# Patient Record
Sex: Male | Born: 1975 | Hispanic: No | Marital: Married | Smoking: Never smoker
Health system: Southern US, Community
[De-identification: ages and names within clinical notes are randomized; demographics above are authoritative.]

## PROBLEM LIST (undated history)

## (undated) DIAGNOSIS — G43909 Migraine, unspecified, not intractable, without status migrainosus: Secondary | ICD-10-CM

## (undated) HISTORY — DX: Migraine, unspecified, not intractable, without status migrainosus: G43.909

---

## 2011-02-04 HISTORY — PX: VASECTOMY: SHX75

## 2015-03-19 ENCOUNTER — Encounter: Payer: Self-pay | Admitting: *Deleted

## 2015-03-19 ENCOUNTER — Telehealth: Payer: Self-pay | Admitting: *Deleted

## 2015-03-19 NOTE — Telephone Encounter (Signed)
Pre-Visit Call completed with patient and chart updated.   Pre-Visit Info documented in Specialty Comments under [employer].    

## 2015-03-20 ENCOUNTER — Telehealth: Payer: Self-pay | Admitting: Family

## 2015-03-20 ENCOUNTER — Ambulatory Visit (INDEPENDENT_AMBULATORY_CARE_PROVIDER_SITE_OTHER): Payer: BLUE CROSS/BLUE SHIELD | Admitting: Family

## 2015-03-20 ENCOUNTER — Encounter: Payer: Self-pay | Admitting: Family

## 2015-03-20 VITALS — BP 137/94 | HR 77 | Temp 97.7°F | Resp 16 | Ht 68.5 in | Wt 182.2 lb

## 2015-03-20 DIAGNOSIS — R030 Elevated blood-pressure reading, without diagnosis of hypertension: Secondary | ICD-10-CM

## 2015-03-20 DIAGNOSIS — Z0001 Encounter for general adult medical examination with abnormal findings: Secondary | ICD-10-CM | POA: Diagnosis not present

## 2015-03-20 DIAGNOSIS — G43809 Other migraine, not intractable, without status migrainosus: Secondary | ICD-10-CM

## 2015-03-20 DIAGNOSIS — G43909 Migraine, unspecified, not intractable, without status migrainosus: Secondary | ICD-10-CM | POA: Insufficient documentation

## 2015-03-20 DIAGNOSIS — Z0000 Encounter for general adult medical examination without abnormal findings: Secondary | ICD-10-CM | POA: Diagnosis not present

## 2015-03-20 DIAGNOSIS — R0789 Other chest pain: Secondary | ICD-10-CM

## 2015-03-20 DIAGNOSIS — IMO0001 Reserved for inherently not codable concepts without codable children: Secondary | ICD-10-CM

## 2015-03-20 LAB — URINALYSIS, ROUTINE W REFLEX MICROSCOPIC
BILIRUBIN URINE: NEGATIVE
Hgb urine dipstick: NEGATIVE
Ketones, ur: NEGATIVE
Leukocytes, UA: NEGATIVE
Nitrite: NEGATIVE
PH: 7.5 (ref 5.0–8.0)
RBC / HPF: NONE SEEN (ref 0–?)
SPECIFIC GRAVITY, URINE: 1.02 (ref 1.000–1.030)
TOTAL PROTEIN, URINE-UPE24: NEGATIVE
UROBILINOGEN UA: 0.2 (ref 0.0–1.0)
Urine Glucose: NEGATIVE
WBC, UA: NONE SEEN (ref 0–?)

## 2015-03-20 LAB — CBC WITH DIFFERENTIAL/PLATELET
BASOS PCT: 0.6 % (ref 0.0–3.0)
Basophils Absolute: 0.1 10*3/uL (ref 0.0–0.1)
EOS ABS: 0 10*3/uL (ref 0.0–0.7)
Eosinophils Relative: 0.3 % (ref 0.0–5.0)
HCT: 44.9 % (ref 39.0–52.0)
HEMOGLOBIN: 15.5 g/dL (ref 13.0–17.0)
Lymphocytes Relative: 22.7 % (ref 12.0–46.0)
Lymphs Abs: 2.1 10*3/uL (ref 0.7–4.0)
MCHC: 34.5 g/dL (ref 30.0–36.0)
MCV: 87.4 fl (ref 78.0–100.0)
MONO ABS: 0.4 10*3/uL (ref 0.1–1.0)
Monocytes Relative: 3.9 % (ref 3.0–12.0)
NEUTROS PCT: 72.5 % (ref 43.0–77.0)
Neutro Abs: 6.8 10*3/uL (ref 1.4–7.7)
Platelets: 248 10*3/uL (ref 150.0–400.0)
RBC: 5.14 Mil/uL (ref 4.22–5.81)
RDW: 12.7 % (ref 11.5–15.5)
WBC: 9.4 10*3/uL (ref 4.0–10.5)

## 2015-03-20 LAB — BASIC METABOLIC PANEL
BUN: 13 mg/dL (ref 6–23)
CO2: 30 mEq/L (ref 19–32)
CREATININE: 0.88 mg/dL (ref 0.40–1.50)
Calcium: 9.7 mg/dL (ref 8.4–10.5)
Chloride: 101 mEq/L (ref 96–112)
GFR: 102.72 mL/min (ref >60.00)
Glucose, Bld: 103 mg/dL — ABNORMAL HIGH (ref 70–99)
Potassium: 3.8 mEq/L (ref 3.5–5.1)
Sodium: 139 mEq/L (ref 135–145)

## 2015-03-20 LAB — HEPATIC FUNCTION PANEL
ALBUMIN: 4.8 g/dL (ref 3.5–5.2)
ALK PHOS: 75 U/L (ref 39–117)
ALT: 33 U/L (ref 0–53)
AST: 23 U/L (ref 0–37)
Bilirubin, Direct: 0.1 mg/dL (ref 0.0–0.3)
TOTAL PROTEIN: 7.7 g/dL (ref 6.0–8.3)
Total Bilirubin: 0.4 mg/dL (ref 0.2–1.2)

## 2015-03-20 LAB — LIPID PANEL
CHOL/HDL RATIO: 4
CHOLESTEROL: 184 mg/dL (ref 0–200)
HDL: 42.6 mg/dL (ref >39.00)
LDL Cholesterol: 116 mg/dL — ABNORMAL HIGH (ref 0–99)
NONHDL: 141.63
Triglycerides: 130 mg/dL (ref 0.0–149.0)
VLDL: 26 mg/dL (ref 0.0–40.0)

## 2015-03-20 LAB — TSH: TSH: 1.18 u[IU]/mL (ref 0.35–4.50)

## 2015-03-20 MED ORDER — RANITIDINE HCL 150 MG PO TABS: 150.0000 mg | ORAL_TABLET | Freq: Two times a day (BID) | ORAL | Status: DC

## 2015-03-20 MED ORDER — SUMATRIPTAN SUCCINATE 50 MG PO TABS: 50.0000 mg | ORAL_TABLET | ORAL | Status: DC | PRN

## 2015-03-20 NOTE — Progress Notes (Signed)
Pre visit review using our clinic review tool, if applicable. No additional management support is needed unless otherwise documented below in the visit note. 

## 2015-03-20 NOTE — Patient Instructions (Addendum)
Start zantac twice daily for reflux. Avoid spicy/greasy foods. Eat small frequent meals rather than larger meals. Avoid eating for 1.5 hours before bed. Continue your regular exercise and work on modest weight loss- goal weight is about 165. You may use imitrex as needed for migraine. Work on low sodium diet to help with your blood pressure.  Go to the ER if you develop severe chest pain.  Welcome to [facility]!

## 2015-03-20 NOTE — Telephone Encounter (Signed)
Notified pt and he voices understanding. 

## 2015-03-20 NOTE — Assessment & Plan Note (Addendum)
Suspect GERD. 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  Will also rx zantac, discussed avoiding food x 1.5 hrs before bed.

## 2015-03-20 NOTE — Telephone Encounter (Signed)
Reviewed EKG- notes right bundle branch block. This is a change in the conduction of the heart which usually is not concerning.  However, given his report of intermittent chest pain, I would like to refer him to cardiology and let them decide on appropriate additional tests.

## 2015-03-20 NOTE — Progress Notes (Signed)
Subjective:    Patient ID: Phillip Wheeler, male    DOB: [birth year], 40 y.o.   MRN: [id number]  HPI  Phillip Wheeler is a 40 yr old male who presents today to establish care.    Pmhx is significant for migraine.  Pt reports that he generally experiences migraine once every 2-3 months.   Reports + migraine today. + associated nausea.  Reports that if his migraine is severe uses ibuprofen with some improvement.    Immunizations:declined flu vaccine. Tdap unknown.   Diet:healthy, stopped eating rice, eating whole wheat Exercise: 3-4 miles a day Vision: up to date Dental:  Last year.    Review of Systems  Constitutional: Negative for unexpected weight change.  HENT: Negative for hearing loss and rhinorrhea.   Eyes: Negative for visual disturbance.  Respiratory: Negative for cough and shortness of breath.   Cardiovascular:       Notes some occasional substernal chest pain,  Happens more when he lays flat  Gastrointestinal: Negative for diarrhea and constipation.  Genitourinary: Negative for dysuria and frequency.  Musculoskeletal: Negative for myalgias and arthralgias.  Neurological: Positive for headaches.  Hematological: Negative for adenopathy.  Psychiatric/Behavioral:       Denies depression/anxiety   Past Medical History  Diagnosis Date  . Migraine     Social History   Social History  . Marital Status: Married    Spouse Name: N/A  . Number of Children: N/A  . Years of Education: N/A   Occupational History  . Not on file.   Social History Main Topics  . Smoking status: Never Smoker   . Smokeless tobacco: Never Used  . Alcohol Use: No  . Drug Use: Not on file  . Sexual Activity: Not on file   Other Topics Concern  . Not on file   Social History Narrative    Past Surgical History  Procedure Laterality Date  . Vasectomy  2013    Family History  Problem Relation Age of Onset  . Diabetes Father   . Hypertension Father   . Heart disease Father   .  Hypertension Mother   . Hypothyroidism Mother   . Cancer Neg Hx   . Hyperlipidemia Neg Hx     No Known Allergies  No current outpatient prescriptions on file prior to visit.   No current facility-administered medications on file prior to visit.    BP 137/94 mmHg  Pulse 77  Temp(Src) 97.7 F (36.5 C) (Oral)  Resp 16  Ht 5' 8.5" (1.74 m)  Wt 182 lb 3.2 oz (82.645 kg)  BMI 27.30 kg/m2  SpO2 100%       Objective:   Physical Exam Physical Exam  Constitutional: He is oriented to person, place, and time. He appears well-developed and well-nourished. No distress.  HENT:  Head: Normocephalic and atraumatic.  Right Ear: Tympanic membrane and ear canal normal.  Left Ear: Tympanic membrane and ear canal normal.  Mouth/Throat: Oropharynx is clear and moist.  Eyes: Pupils are equal, round, and reactive to light. No scleral icterus.  Neck: Normal range of motion. No thyromegaly present.  Cardiovascular: Normal rate and regular rhythm.   No murmur heard. Pulmonary/Chest: Effort normal and breath sounds normal. No respiratory distress. He has no wheezes. He has no rales. He exhibits no tenderness.  Abdominal: Soft. Bowel sounds are normal. He exhibits no distension and no mass. There is no tenderness. There is no rebound and no guarding.  Musculoskeletal: He exhibits no edema.  Lymphadenopathy:  He has no cervical adenopathy.  Neurological: He is alert and oriented to person, place, and time. He has normal patellar reflexes. He exhibits normal muscle tone. Coordination normal.  Skin: Skin is warm and dry.  Psychiatric: He has a normal mood and affect. His behavior is normal. Judgment and thought content normal.          Assessment & Plan:   Preventative Care- discussed healthy low sodium diet, exercise, modest weight loss (goal BMI <25).        Assessment & Plan:

## 2015-03-20 NOTE — Assessment & Plan Note (Signed)
Trial of PRN imitrex.

## 2015-03-20 NOTE — Assessment & Plan Note (Signed)
Discussed low sodium diet, exercise, weight loss.  Plan to repeat bp in 3 months.

## 2015-03-23 ENCOUNTER — Telehealth: Payer: Self-pay | Admitting: Family

## 2015-03-23 NOTE — Telephone Encounter (Signed)
Relation to NW:GNFA Call back number:[phone] Pharmacy:  Reason for call:  Patient inquiring of lab results taken 03/20/15

## 2015-03-26 NOTE — Telephone Encounter (Signed)
Notified pt's wife and she voices understanding. 

## 2016-08-28 ENCOUNTER — Ambulatory Visit (INDEPENDENT_AMBULATORY_CARE_PROVIDER_SITE_OTHER): Payer: Self-pay | Admitting: Family

## 2016-08-28 VITALS — BP 119/55 | HR 64 | Temp 97.5°F | Ht 69.0 in | Wt 178.8 lb

## 2016-08-28 DIAGNOSIS — Z0000 Encounter for general adult medical examination without abnormal findings: Secondary | ICD-10-CM

## 2016-08-28 LAB — BASIC METABOLIC PANEL
BUN: 12 mg/dL (ref 6–23)
CHLORIDE: 104 meq/L (ref 96–112)
CO2: 28 meq/L (ref 19–32)
Calcium: 9.7 mg/dL (ref 8.4–10.5)
Creatinine, Ser: 0.9 mg/dL (ref 0.40–1.50)
GFR: 99.34 mL/min (ref >60.00)
Glucose, Bld: 106 mg/dL — ABNORMAL HIGH (ref 70–99)
POTASSIUM: 4.1 meq/L (ref 3.5–5.1)
SODIUM: 139 meq/L (ref 135–145)

## 2016-08-28 LAB — URINALYSIS, ROUTINE W REFLEX MICROSCOPIC
BILIRUBIN URINE: NEGATIVE
Hgb urine dipstick: NEGATIVE
KETONES UR: NEGATIVE
LEUKOCYTES UA: NEGATIVE
Nitrite: NEGATIVE
PH: 5.5 (ref 5.0–8.0)
RBC / HPF: NONE SEEN (ref 0–?)
SPECIFIC GRAVITY, URINE: 1.025 (ref 1.000–1.030)
Total Protein, Urine: NEGATIVE
URINE GLUCOSE: NEGATIVE
UROBILINOGEN UA: 0.2 (ref 0.0–1.0)

## 2016-08-28 LAB — CBC WITH DIFFERENTIAL/PLATELET
BASOS PCT: 0.5 % (ref 0.0–3.0)
Basophils Absolute: 0 10*3/uL (ref 0.0–0.1)
EOS PCT: 0.9 % (ref 0.0–5.0)
Eosinophils Absolute: 0.1 10*3/uL (ref 0.0–0.7)
HEMATOCRIT: 46.6 % (ref 39.0–52.0)
HEMOGLOBIN: 15.9 g/dL (ref 13.0–17.0)
LYMPHS PCT: 37 % (ref 12.0–46.0)
Lymphs Abs: 2.4 10*3/uL (ref 0.7–4.0)
MCHC: 34.1 g/dL (ref 30.0–36.0)
MCV: 89.7 fl (ref 78.0–100.0)
MONOS PCT: 7.5 % (ref 3.0–12.0)
Monocytes Absolute: 0.5 10*3/uL (ref 0.1–1.0)
NEUTROS ABS: 3.5 10*3/uL (ref 1.4–7.7)
Neutrophils Relative %: 54.1 % (ref 43.0–77.0)
PLATELETS: 219 10*3/uL (ref 150.0–400.0)
RBC: 5.19 Mil/uL (ref 4.22–5.81)
RDW: 12.8 % (ref 11.5–15.5)
WBC: 6.5 10*3/uL (ref 4.0–10.5)

## 2016-08-28 LAB — LIPID PANEL
Cholesterol: 162 mg/dL (ref 0–200)
HDL: 41 mg/dL (ref >39.00)
LDL Cholesterol: 93 mg/dL (ref 0–99)
NONHDL: 121.02
TRIGLYCERIDES: 138 mg/dL (ref 0.0–149.0)
Total CHOL/HDL Ratio: 4
VLDL: 27.6 mg/dL (ref 0.0–40.0)

## 2016-08-28 LAB — HEPATIC FUNCTION PANEL
ALBUMIN: 4.4 g/dL (ref 3.5–5.2)
ALT: 29 U/L (ref 0–53)
AST: 24 U/L (ref 0–37)
Alkaline Phosphatase: 63 U/L (ref 39–117)
Bilirubin, Direct: 0.1 mg/dL (ref 0.0–0.3)
TOTAL PROTEIN: 7.4 g/dL (ref 6.0–8.3)
Total Bilirubin: 0.4 mg/dL (ref 0.2–1.2)

## 2016-08-28 LAB — TSH: TSH: 1.5 u[IU]/mL (ref 0.35–4.50)

## 2016-08-28 NOTE — Patient Instructions (Addendum)
Please complete lab work prior to leaving.  Continue healthy diet and regular exercise.  

## 2016-08-28 NOTE — Progress Notes (Signed)
Subjective:    Patient ID: Phillip Wheeler, male    DOB: 09/20/1975, 41 y.o.   MRN: [id number]  HPI  Patient presents today for complete physical.  Immunizations: 2013 tdap Diet: healthy Exercise: walks every day Vision:  Due advised to schedule appointment Dental: up to date Wt Readings from Last 3 Encounters:  08/28/16 178 lb 12.8 oz (81.1 kg)  03/20/15 182 lb 3.2 oz (82.6 kg)      Review of Systems  Constitutional: Negative for unexpected weight change.  HENT: Negative for hearing loss and rhinorrhea.   Eyes: Negative for visual disturbance.  Respiratory: Negative for cough and shortness of breath.   Cardiovascular: Negative for chest pain and leg swelling.  Gastrointestinal: Negative for blood in stool, constipation and diarrhea.  Genitourinary: Negative for dysuria, frequency and hematuria.  Musculoskeletal: Negative for arthralgias and myalgias.  Skin: Negative for rash.  Neurological:       Occasional headaches- rare migraine about 2 times a year, usually uses tylenol  Hematological: Negative for adenopathy.  Psychiatric/Behavioral:       Denies anxiety   Past Medical History:  Diagnosis Date  . Migraine      Social History   Social History  . Marital status: Married    Spouse name: N/A  . Number of children: N/A  . Years of education: N/A   Occupational History  . Not on file.   Social History Main Topics  . Smoking status: Never Smoker  . Smokeless tobacco: Never Used  . Alcohol use No  . Drug use: Unknown  . Sexual activity: Not on file   Other Topics Concern  . Not on file   Social History Narrative   Works as a Magazine features editorprogrammer, Consulting civil engineerT.   Married   2 children   [place] up [place], moved to [place] 2006   Enjoys Clinical biochemistcomputer programming    Past Surgical History:  Procedure Laterality Date  . VASECTOMY  2013    Family History  Problem Relation Age of Onset  . Diabetes Father   . Hypertension Father   . Heart disease Father   .  Hypertension Mother   . Hypothyroidism Mother   . Cancer Neg Hx   . Hyperlipidemia Neg Hx     No Known Allergies  Current Outpatient Prescriptions on File Prior to Visit  Medication Sig Dispense Refill  . ranitidine (ZANTAC) 150 MG tablet Take 1 tablet (150 mg total) by mouth 2 (two) times daily. 60 tablet 2  . SUMAtriptan (IMITREX) 50 MG tablet Take 1 tablet (50 mg total) by mouth every 2 (two) hours as needed for migraine. May repeat in 2 hours if headache persists or recurs. Max 2 tabs/24hrs 10 tablet 2   No current facility-administered medications on file prior to visit.     BP (!) 119/55   Pulse 64   Temp (!) 97.5 F (36.4 C) (Oral)   Ht 5\' 9"  (1.753 m)   Wt 178 lb 12.8 oz (81.1 kg)   SpO2 99%   BMI 26.40 kg/m       Objective:   Physical Exam  Physical Exam  Constitutional: He is oriented to person, place, and time. He appears well-developed and well-nourished. No distress.  HENT:  Head: Normocephalic and atraumatic.  Right Ear: Tympanic membrane and ear canal normal.  Left Ear: Tympanic membrane and ear canal normal.  Mouth/Throat: Oropharynx is clear and moist.  Eyes: Pupils are equal, round, and reactive to light. No scleral icterus.  Neck: Normal range of motion. No thyromegaly present.  Cardiovascular: Normal rate and regular rhythm.   No murmur heard. Pulmonary/Chest: Effort normal and breath sounds normal. No respiratory distress. He has no wheezes. He has no rales. He exhibits no tenderness.  Abdominal: Soft. Bowel sounds are normal. He exhibits no distension and no mass. There is no tenderness. There is no rebound and no guarding.  Musculoskeletal: He exhibits no edema.  Lymphadenopathy:    He has no cervical adenopathy.  Neurological: He is alert and oriented to person, place, and time. He has normal patellar reflexes. He exhibits normal muscle tone. Coordination normal.  Skin: Skin is warm and dry.  Psychiatric: He has a normal mood and affect. His  behavior is normal. Judgment and thought content normal.           Assessment & Plan:   Preventative care- encouraged pt to continue healthy diet and regular exercise. Obtain routine lab work.           Assessment & Plan:

## 2016-12-22 ENCOUNTER — Ambulatory Visit (HOSPITAL_BASED_OUTPATIENT_CLINIC_OR_DEPARTMENT_OTHER)
Admission: RE | Admit: 2016-12-22 | Discharge: 2016-12-22 | Disposition: A | Discharge status: Home / Self Care | Payer: BLUE CROSS/BLUE SHIELD | Source: Ambulatory Visit | Attending: Family | Admitting: Family

## 2016-12-22 ENCOUNTER — Encounter: Payer: Self-pay | Admitting: Family

## 2016-12-22 ENCOUNTER — Telehealth: Payer: Self-pay | Admitting: Family

## 2016-12-22 ENCOUNTER — Ambulatory Visit (INDEPENDENT_AMBULATORY_CARE_PROVIDER_SITE_OTHER): Payer: BLUE CROSS/BLUE SHIELD | Admitting: Family

## 2016-12-22 VITALS — BP 144/89 | HR 59 | Temp 97.6°F | Resp 16 | Ht 69.0 in | Wt 179.0 lb

## 2016-12-22 DIAGNOSIS — R030 Elevated blood-pressure reading, without diagnosis of hypertension: Secondary | ICD-10-CM | POA: Diagnosis not present

## 2016-12-22 DIAGNOSIS — M25571 Pain in right ankle and joints of right foot: Secondary | ICD-10-CM

## 2016-12-22 DIAGNOSIS — M7989 Other specified soft tissue disorders: Secondary | ICD-10-CM | POA: Insufficient documentation

## 2016-12-22 DIAGNOSIS — T148XXA Other injury of unspecified body region, initial encounter: Secondary | ICD-10-CM

## 2016-12-22 NOTE — Telephone Encounter (Signed)
Notified pt and he voices understanding and is agreeable to proceed with referral. 

## 2016-12-22 NOTE — Telephone Encounter (Signed)
X ray shows bone chip in one of the bones in his foot. I would like him to see sports med. We will arrange.

## 2016-12-22 NOTE — Patient Instructions (Addendum)
Please complete x ray on the first floor. You may use ibuprofen as needed for pain and ice twice daily as needed. Call if pain worsens or fails to improve.

## 2016-12-22 NOTE — Telephone Encounter (Signed)
See phone note from earlier today. 
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
## 2016-12-22 NOTE — Telephone Encounter (Signed)
Pt called in to request imaging results. Please call back

## 2016-12-23 ENCOUNTER — Ambulatory Visit (INDEPENDENT_AMBULATORY_CARE_PROVIDER_SITE_OTHER): Payer: BLUE CROSS/BLUE SHIELD | Admitting: Family Medicine

## 2016-12-23 ENCOUNTER — Ambulatory Visit: Payer: Self-pay | Admitting: Family

## 2016-12-23 ENCOUNTER — Encounter: Payer: Self-pay | Admitting: Family Medicine

## 2016-12-23 DIAGNOSIS — S99911A Unspecified injury of right ankle, initial encounter: Secondary | ICD-10-CM

## 2016-12-23 NOTE — Patient Instructions (Signed)
You have an ankle sprain. I don't think you have an avulsion fracture - this is an accessory ossicle on the outside of your ankle. Ice the area for 15 minutes at a time, 3-4 times a day Aleve 2 tabs twice a day with food OR ibuprofen 3 tabs three times a day with food for pain and inflammation - typically take for 7-10 days then as needed. Use ACE wrap for compression when up and walking around to help with stability and swelling while you recover from this injury. Start theraband strengthening exercises each direction 3 sets of 10. Consider physical therapy for strengthening and balance exercises. If not improving as expected, we may repeat x-rays or consider further testing like an MRI. Follow up with me in 4 weeks for reevaluation.

## 2016-12-27 ENCOUNTER — Encounter: Payer: Self-pay | Admitting: Family Medicine

## 2016-12-27 DIAGNOSIS — S99911A Unspecified injury of right ankle, initial encounter: Secondary | ICD-10-CM | POA: Insufficient documentation
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
## 2016-12-27 NOTE — Assessment & Plan Note (Signed)
2/2 medial ankle sprain.  Independently reviewed radiographs - no tenderness in area of possible avulsion fracture, consistent with accessory ossicle.  Icing, aleve or ibuprofen for 7-10 days then as needed.  Start theraband strengthening exercises which were reviewed.  Discussed ASO, ACE wrap - he would like to do ACE wrap.  Consider PT as well.  F/u in 4 weeks.

## 2017-01-20 ENCOUNTER — Ambulatory Visit: Payer: BLUE CROSS/BLUE SHIELD | Admitting: Family Medicine

## 2017-02-06 ENCOUNTER — Ambulatory Visit: Payer: BLUE CROSS/BLUE SHIELD | Admitting: Family

## 2017-03-26 ENCOUNTER — Telehealth: Payer: Self-pay

## 2017-03-26 NOTE — Telephone Encounter (Signed)
Medical Record Request received from [employer], form forwarded to [place], Research officer, political partypractice administrator for scanning/emailing.

## 2017-05-16 ENCOUNTER — Telehealth: Payer: BLUE CROSS/BLUE SHIELD | Admitting: Family Medicine

## 2017-05-16 DIAGNOSIS — H1011 Acute atopic conjunctivitis, right eye: Secondary | ICD-10-CM

## 2017-05-16 MED ORDER — POLYMYXIN B-TRIMETHOPRIM 10000-0.1 UNIT/ML-% OP SOLN: 1.0000 [drp] | Freq: Four times a day (QID) | OPHTHALMIC | 0 refills | Status: AC

## 2017-05-16 MED ORDER — POLYMYXIN B-TRIMETHOPRIM 10000-0.1 UNIT/ML-% OP SOLN: 1.0000 [drp] | Freq: Four times a day (QID) | OPHTHALMIC | 0 refills | Status: DC

## 2017-05-16 NOTE — Progress Notes (Signed)
We are sorry that you are not feeling well.  Here is how we plan to help!  Based on what you have shared with me it looks like you have conjunctivitis.  Conjunctivitis is a common inflammatory or infectious condition of the eye that is often referred to as "pink eye".  In most cases it is contagious (viral or bacterial). However, not all conjunctivitis requires antibiotics (ex. Allergic).  We have made appropriate suggestions for you based upon your presentation.  I have prescribed Polytrim Ophthalmic drops 1-2 drops 4 times a day times 5 days  Medication has been routed to pharmacy on file-[facility] [address] [place], [place]   Pink eye can be highly contagious.  It is typically spread through direct contact with secretions, or contaminated objects or surfaces that one may have touched.  Strict handwashing is suggested with soap and water is urged.  If not available, use alcohol based had sanitizer.  Avoid unnecessary touching of the eye.  If you wear contact lenses, you will need to refrain from wearing them until you see no white discharge from the eye for at least 24 hours after being on medication.  You should see symptom improvement in 1-2 days after starting the medication regimen.  Call us if symptoms are not improved in 1-2 days.  Home Care:  Wash your hands often!  Do not wear your contacts until you complete your treatment plan.  Avoid sharing towels, bed linen, personal items with a person who has pink eye.  See attention for anyone in your home with similar symptoms.  Get Help Right Away If:  Your symptoms do not improve.  You develop blurred or loss of vision.  Your symptoms worsen (increased discharge, pain or redness)  Your e-visit answers were reviewed by a board certified advanced clinical practitioner to complete your personal care plan.  Depending on the condition, your plan could have included both over the counter or prescription medications.  If there is a problem  please reply  once you have received a response from your provider.  Your safety is important to us.  If you have drug allergies check your prescription carefully.    You can use MyChart to ask questions about today's visit, request a non-urgent call back, or ask for a work or school excuse for 24 hours related to this e-Visit. If it has been greater than 24 hours you will need to follow up with your provider, or enter a new e-Visit to address those concerns.   You will get an e-mail in the next two days asking about your experience.  I hope that your e-visit has been valuable and will speed your recovery. Thank you for using e-visits.
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
## 2019-09-06 ENCOUNTER — Telehealth: Payer: Self-pay | Admitting: Family

## 2019-09-06 NOTE — Telephone Encounter (Signed)
Caller: Wendall Call back # [phone]  Patient would like to re-establish care.

## 2019-09-07 NOTE — Telephone Encounter (Signed)
Please advise 

## 2019-09-08 NOTE — Telephone Encounter (Signed)
Ok

## 2019-09-08 NOTE — Telephone Encounter (Signed)
Okay to re-establish care w/ Melissa. Please schedule at her convenience. TY

## 2019-11-07 ENCOUNTER — Encounter: Payer: Self-pay | Admitting: Family

## 2019-11-07 ENCOUNTER — Other Ambulatory Visit: Payer: Self-pay

## 2019-11-07 ENCOUNTER — Ambulatory Visit (INDEPENDENT_AMBULATORY_CARE_PROVIDER_SITE_OTHER): Payer: BC Managed Care – PPO | Admitting: Family

## 2019-11-07 VITALS — BP 122/72 | HR 67 | Resp 16 | Ht 70.0 in | Wt 191.0 lb

## 2019-11-07 DIAGNOSIS — R739 Hyperglycemia, unspecified: Secondary | ICD-10-CM | POA: Diagnosis not present

## 2019-11-07 DIAGNOSIS — E1169 Type 2 diabetes mellitus with other specified complication: Secondary | ICD-10-CM | POA: Diagnosis not present

## 2019-11-07 DIAGNOSIS — Z23 Encounter for immunization: Secondary | ICD-10-CM | POA: Diagnosis not present

## 2019-11-07 DIAGNOSIS — E785 Hyperlipidemia, unspecified: Secondary | ICD-10-CM | POA: Diagnosis not present

## 2019-11-07 DIAGNOSIS — Z0000 Encounter for general adult medical examination without abnormal findings: Secondary | ICD-10-CM | POA: Diagnosis not present

## 2019-11-07 DIAGNOSIS — L989 Disorder of the skin and subcutaneous tissue, unspecified: Secondary | ICD-10-CM

## 2019-11-07 NOTE — Addendum Note (Signed)
Addended by: Wilford Corner on: 11/07/2019 08:09 AM   Modules accepted: Orders

## 2019-11-07 NOTE — Patient Instructions (Addendum)
Please schedule routine eye exam and dental exam. Continue healthy diet, exercise and weight loss efforts.  You should be contacted about scheduling your appointment with Dermatology.    Preventive Care 14-43 Years Old, Male Preventive care refers to lifestyle choices and visits with your health care provider that can promote health and wellness. This includes:  A yearly physical exam. This is also called an annual well check.  Regular dental and eye exams.  Immunizations.  Screening for certain conditions.  Healthy lifestyle choices, such as eating a healthy diet, getting regular exercise, not using drugs or products that contain nicotine and tobacco, and limiting alcohol use. What can I expect for my preventive care visit? Physical exam Your health care provider will check:  Height and weight. These may be used to calculate body mass index (BMI), which is a measurement that tells if you are at a healthy weight.  Heart rate and blood pressure.  Your skin for abnormal spots. Counseling Your health care provider may ask you questions about:  Alcohol, tobacco, and drug use.  Emotional well-being.  Home and relationship well-being.  Sexual activity.  Eating habits.  Work and work Statistician. What immunizations do I need?  Influenza (flu) vaccine  This is recommended every year. Tetanus, diphtheria, and pertussis (Tdap) vaccine  You may need a Td booster every 10 years. Varicella (chickenpox) vaccine  You may need this vaccine if you have not already been vaccinated. Zoster (shingles) vaccine  You may need this after age 7. Measles, mumps, and rubella (MMR) vaccine  You may need at least one dose of MMR if you were born in 1957 or later. You may also need a second dose. Pneumococcal conjugate (PCV13) vaccine  You may need this if you have certain conditions and were not previously vaccinated. Pneumococcal polysaccharide (PPSV23) vaccine  You may need one or  two doses if you smoke cigarettes or if you have certain conditions. Meningococcal conjugate (MenACWY) vaccine  You may need this if you have certain conditions. Hepatitis A vaccine  You may need this if you have certain conditions or if you travel or work in places where you may be exposed to hepatitis A. Hepatitis B vaccine  You may need this if you have certain conditions or if you travel or work in places where you may be exposed to hepatitis B. Haemophilus influenzae type b (Hib) vaccine  You may need this if you have certain risk factors. Human papillomavirus (HPV) vaccine  If recommended by your health care provider, you may need three doses over 6 months. You may receive vaccines as individual doses or as more than one vaccine together in one shot (combination vaccines). Talk with your health care provider about the risks and benefits of combination vaccines. What tests do I need? Blood tests  Lipid and cholesterol levels. These may be checked every 5 years, or more frequently if you are over 74 years old.  Hepatitis C test.  Hepatitis B test. Screening  Lung cancer screening. You may have this screening every year starting at age 86 if you have a 30-pack-year history of smoking and currently smoke or have quit within the past 15 years.  Prostate cancer screening. Recommendations will vary depending on your family history and other risks.  Colorectal cancer screening. All adults should have this screening starting at age 52 and continuing until age 52. Your health care provider may recommend screening at age 55 if you are at increased risk. You will have tests  every 1-10 years, depending on your results and the type of screening test.  Diabetes screening. This is done by checking your blood sugar (glucose) after you have not eaten for a while (fasting). You may have this done every 1-3 years.  Sexually transmitted disease (STD) testing. Follow these instructions at  home: Eating and drinking  Eat a diet that includes fresh fruits and vegetables, whole grains, lean protein, and low-fat dairy products.  Take vitamin and mineral supplements as recommended by your health care provider.  Do not drink alcohol if your health care provider tells you not to drink.  If you drink alcohol: ? Limit how much you have to 0-2 drinks a day. ? Be aware of how much alcohol is in your drink. In the [place] one drink equals one 12 oz bottle of beer (355 mL), one 5 oz glass of wine (148 mL), or one 1 oz glass of hard liquor (44 mL). Lifestyle  Take daily care of your teeth and gums.  Stay active. Exercise for at least 30 minutes on 5 or more days each week.  Do not use any products that contain nicotine or tobacco, such as cigarettes, e-cigarettes, and chewing tobacco. If you need help quitting, ask your health care provider.  If you are sexually active, practice safe sex. Use a condom or other form of protection to prevent STIs (sexually transmitted infections).  Talk with your health care provider about taking a low-dose aspirin every day starting at age 58. What's next?  Go to your health care provider once a year for a well check visit.  Ask your health care provider how often you should have your eyes and teeth checked.  Stay up to date on all vaccines. This information is not intended to replace advice given to you by your health care provider. Make sure you discuss any questions you have with your health care provider. Document Revised: 01/14/2018 Document Reviewed: 01/14/2018 Elsevier Patient Education  2020 [employer].

## 2019-11-07 NOTE — Progress Notes (Signed)
Subjective:    Patient ID: Phillip Wheeler, male    DOB: [birth year], 44 y.o.   MRN: [id number]  HPI  Patient is a 44 yr old male who presents today to re-establish care.  Last visit was 12/23/19.    Patient presents today for complete physical.  Immunizations:  tdap 2013, completed pfizer Diet: reports healthy diet Exercise: tries to get 10000 steps a day Vision: due Dental: due  Wt Readings from Last 3 Encounters:  11/07/19 191 lb (86.6 kg)  12/23/16 178 lb (80.7 kg)  12/22/16 179 lb (81.2 kg)   He c/o skin lesion left foot.  Has been there several months.  Denies known trauma.   Review of Systems  Constitutional: Negative for unexpected weight change.  HENT: Negative for hearing loss and rhinorrhea.   Eyes: Negative for visual disturbance.  Respiratory: Negative for cough.   Cardiovascular: Negative for leg swelling.  Gastrointestinal: Negative for constipation and diarrhea.  Genitourinary: Negative for dysuria and frequency.  Musculoskeletal: Negative for arthralgias and myalgias.  Skin: Negative for rash.  Neurological: Negative for headaches.  Hematological: Negative for adenopathy.  Psychiatric/Behavioral:       Denies depression/anxiety    Past Medical History:  Diagnosis Date  . Migraine      Social History   Socioeconomic History  . Marital status: Married    Spouse name: Not on file  . Number of children: Not on file  . Years of education: Not on file  . Highest education level: Not on file  Occupational History  . Not on file  Tobacco Use  . Smoking status: Never Smoker  . Smokeless tobacco: Never Used  Substance and Sexual Activity  . Alcohol use: No    Alcohol/week: 0.0 standard drinks  . Drug use: Not on file  . Sexual activity: Not on file  Other Topics Concern  . Not on file  Social History Narrative   Works as a Magazine features editor, Consulting civil engineer.   Married   2 children   Grew up in [place], moved to [place] for work 2006   Enjoys Clinical biochemist    Social Determinants of Corporate investment banker Strain:   . Difficulty of Paying Living Expenses: Not on file  Food Insecurity:   . Worried About Programme researcher, broadcasting/film/video in the Last Year: Not on file  . Ran Out of Food in the Last Year: Not on file  Transportation Needs:   . Lack of Transportation (Medical): Not on file  . Lack of Transportation (Non-Medical): Not on file  Physical Activity:   . Days of Exercise per Week: Not on file  . Minutes of Exercise per Session: Not on file  Stress:   . Feeling of Stress : Not on file  Social Connections:   . Frequency of Communication with Friends and Family: Not on file  . Frequency of Social Gatherings with Friends and Family: Not on file  . Attends Religious Services: Not on file  . Active Member of Clubs or Organizations: Not on file  . Attends Banker Meetings: Not on file  . Marital Status: Not on file  Intimate Partner Violence:   . Fear of Current or Ex-Partner: Not on file  . Emotionally Abused: Not on file  . Physically Abused: Not on file  . Sexually Abused: Not on file    Past Surgical History:  Procedure Laterality Date  . VASECTOMY  2013    Family History  Problem Relation Age of Onset  .  Diabetes Father   . Hypertension Father   . Heart disease Father   . Hypertension Mother   . Hypothyroidism Mother   . Cancer Neg Hx   . Hyperlipidemia Neg Hx     No Known Allergies  No current outpatient medications on file prior to visit.   No current facility-administered medications on file prior to visit.    BP 122/72 (BP Location: Left Arm, Patient Position: Sitting, Cuff Size: Normal)   Pulse 67   Resp 16   Ht 5\' 10"  (1.778 m)   Wt 191 lb (86.6 kg)   SpO2 99%   BMI 27.41 kg/m       Objective:   Physical Exam  Physical Exam  Constitutional: He is oriented to person, place, and time. He appears well-developed and well-nourished. No distress.  HENT:  Head: Normocephalic and atraumatic.   Right Ear: Tympanic membrane and ear canal normal.  Left Ear: Tympanic membrane and ear canal normal.  Mouth/Throat: Oropharynx is clear and moist.  Eyes: Pupils are equal, round, and reactive to light. No scleral icterus.  Neck: Normal range of motion. No thyromegaly present.  Cardiovascular: Normal rate and regular rhythm.   No murmur heard. Pulmonary/Chest: Effort normal and breath sounds normal. No respiratory distress. He has no wheezes. He has no rales. He exhibits no tenderness.  Abdominal: Soft. Bowel sounds are normal. He exhibits no distension and no mass. There is no tenderness. There is no rebound and no guarding.  Musculoskeletal: He exhibits no edema.  Lymphadenopathy:    He has no cervical adenopathy.  Neurological: He is alert and oriented to person, place, and time. He has normal patellar reflexes. He exhibits normal muscle tone. Coordination normal.  Skin: Skin is warm and dry. hypertrophic raised lesion left foot above arch Psychiatric: He has a normal mood and affect. His behavior is normal. Judgment and thought content normal.           Assessment & Plan:   Preventative care- flu shot today.  Obtain labs as ordered.  Discussed diet/exercise and weight loss. Tetanus and covid vaccines up to date.   Skin lesion- I think that this is a wart.  I recommended otc corn pads for now and will refer to dermatology for further evaluation/treatment.  This visit occurred during the SARS-CoV-2 public health emergency.  Safety protocols were in place, including screening questions prior to the visit, additional usage of staff PPE, and extensive cleaning of exam room while observing appropriate contact time as indicated for disinfecting solutions.         Assessment & Plan:

## 2019-11-08 LAB — COMPREHENSIVE METABOLIC PANEL
AG Ratio: 1.7 (calc) (ref 1.0–2.5)
ALT: 23 U/L (ref 9–46)
AST: 18 U/L (ref 10–40)
Albumin: 4.4 g/dL (ref 3.6–5.1)
Alkaline phosphatase (APISO): 74 U/L (ref 36–130)
BUN: 13 mg/dL (ref 7–25)
CO2: 27 mmol/L (ref 20–32)
Calcium: 9.6 mg/dL (ref 8.6–10.3)
Chloride: 105 mmol/L (ref 98–110)
Creat: 0.83 mg/dL (ref 0.60–1.35)
Globulin: 2.6 g/dL (calc) (ref 1.9–3.7)
Glucose, Bld: 96 mg/dL (ref 65–99)
Potassium: 4.6 mmol/L (ref 3.5–5.3)
Sodium: 139 mmol/L (ref 135–146)
Total Bilirubin: 0.5 mg/dL (ref 0.2–1.2)
Total Protein: 7 g/dL (ref 6.1–8.1)

## 2019-11-08 LAB — HEMOGLOBIN A1C
Hgb A1c MFr Bld: 5.4 % of total Hgb (ref <5.7)
Mean Plasma Glucose: 108 (calc)
eAG (mmol/L): 6 (calc)

## 2019-11-08 LAB — LIPID PANEL
Cholesterol: 190 mg/dL (ref <200)
HDL: 41 mg/dL (ref >=40)
LDL Cholesterol (Calc): 123 mg/dL (calc) — ABNORMAL HIGH
Non-HDL Cholesterol (Calc): 149 mg/dL (calc) — ABNORMAL HIGH (ref <130)
Total CHOL/HDL Ratio: 4.6 (calc) (ref <5.0)
Triglycerides: 143 mg/dL (ref <150)

## 2020-08-15 ENCOUNTER — Telehealth: Payer: Self-pay

## 2020-08-15 NOTE — Telephone Encounter (Signed)
Patients states he needs malaria vaccine , stated he would need to contact the health department

## 2020-08-15 NOTE — Telephone Encounter (Signed)
Pt called states that he needs vaccines for travel. Pt would like a call.

## 2020-11-07 ENCOUNTER — Encounter: Payer: Self-pay | Admitting: Family

## 2020-11-07 ENCOUNTER — Other Ambulatory Visit: Payer: Self-pay

## 2020-11-07 ENCOUNTER — Ambulatory Visit (INDEPENDENT_AMBULATORY_CARE_PROVIDER_SITE_OTHER): Payer: Managed Care, Other (non HMO) | Admitting: Family

## 2020-11-07 VITALS — BP 132/74 | HR 63 | Temp 98.2°F | Resp 18 | Ht 70.0 in | Wt 186.8 lb

## 2020-11-07 DIAGNOSIS — Z0000 Encounter for general adult medical examination without abnormal findings: Secondary | ICD-10-CM | POA: Insufficient documentation

## 2020-11-07 DIAGNOSIS — Z23 Encounter for immunization: Secondary | ICD-10-CM | POA: Diagnosis not present

## 2020-11-07 NOTE — Assessment & Plan Note (Signed)
Encouraged pt to continue healthy diet, exercise.  Obtain routine lab work at [employer] request. He works for [employer] and reports that lab work is free.  Flu shot and Td today.  Recommended that he obtain bivalent covid booster at the pharmacy.

## 2020-11-07 NOTE — Patient Instructions (Signed)
Please consider getting the new Bivalent Covid Booster. Continue your work on healthy diet and regular exercise.

## 2020-11-07 NOTE — Progress Notes (Signed)
Subjective:     Patient ID: Phillip Wheeler, male    DOB: 1975-07-07, 45 y.o.   MRN: [id number]  Chief Complaint  Patient presents with   Follow-up    HPI  Patient presents today for complete physical.  Immunizations: would like flu shot Wt Readings from Last 3 Encounters:  11/07/20 186 lb 12.8 oz (84.7 kg)  11/07/19 191 lb (86.6 kg)  12/23/16 178 lb (80.7 kg)  Diet: healthy diet Exercise:  stays active 10000 steps a day Colonoscopy: next year Vision: up to date Dental: up to date  Health Maintenance Due  Topic Date Due   URINE MICROALBUMIN  Never done   HIV Screening  Never done   Hepatitis C Screening  Never done   COVID-19 Vaccine (3 - Booster for Pfizer series) 11/20/2019   INFLUENZA VACCINE  09/03/2020    Past Medical History:  Diagnosis Date   Migraine     Past Surgical History:  Procedure Laterality Date   VASECTOMY  2013    Family History  Problem Relation Age of Onset   Diabetes Father    Hypertension Father    Heart disease Father        CABGT   CVA Father    Hypertension Mother    Hypothyroidism Mother    Cancer Neg Hx    Hyperlipidemia Neg Hx     Social History   Socioeconomic History   Marital status: Married    Spouse name: Not on file   Number of children: Not on file   Years of education: Not on file   Highest education level: Not on file  Occupational History   Not on file  Tobacco Use   Smoking status: Never   Smokeless tobacco: Never  Substance and Sexual Activity   Alcohol use: No    Alcohol/week: 0.0 standard drinks   Drug use: Never   Sexual activity: Yes    Partners: Female  Other Topics Concern   Not on file  Social History Narrative   Works as a Scientist, research (physical sciences), Engineer, technical sales.   Married   2 children   2009- son   2012-son   Grew up in [place], moved to [place] for work 2006   Enjoys Investment banker, corporate   Social Determinants of Radio broadcast assistant Strain: Not on [employer] Insecurity: Not on file  Transportation  Needs: Not on file  Physical Activity: Not on file  Stress: Not on file  Social Connections: Not on file  Intimate Partner Violence: Not on file    No outpatient medications prior to visit.   No facility-administered medications prior to visit.    No Known Allergies  Review of Systems  Constitutional:  Negative for fever.  HENT:  Negative for congestion.   Eyes:  Negative for blurred vision.  Respiratory:  Negative for cough and shortness of breath.   Cardiovascular:  Negative for chest pain.  Gastrointestinal:  Negative for blood in stool, constipation and diarrhea.  Genitourinary:  Negative for dysuria and frequency.  Musculoskeletal:  Negative for joint pain and myalgias.  Skin:  Negative for rash.  Neurological:  Negative for headaches.  Psychiatric/Behavioral:         Denies depression/anxiety      Objective:    Physical Exam  BP 132/74   Pulse 63   Temp 98.2 F (36.8 C)   Resp 18   Ht _0  (1.778 m)   Wt 186 lb 12.8 oz (84.7 kg)   SpO2  100%   BMI 26.80 kg/m  Wt Readings from Last 3 Encounters:  11/07/20 186 lb 12.8 oz (84.7 kg)  11/07/19 191 lb (86.6 kg)  12/23/16 178 lb (80.7 kg)   Physical Exam  Constitutional: He is oriented to person, place, and time. He appears well-developed and well-nourished. No distress.  HENT:  Head: Normocephalic and atraumatic.  Right Ear: Tympanic membrane and ear canal normal.  Left Ear: Tympanic membrane and ear canal normal.  Mouth/Throat: not examined- pt wearing mask Eyes: Pupils are equal, round, and reactive to light. No scleral icterus.  Neck: Normal range of motion. No thyromegaly present.  Cardiovascular: Normal rate and regular rhythm.   No murmur heard. Pulmonary/Chest: Effort normal and breath sounds normal. No respiratory distress. He has no wheezes. He has no rales. He exhibits no tenderness.  Abdominal: Soft. Bowel sounds are normal. He exhibits no distension and no mass. There is no tenderness. There is  no rebound and no guarding.  Musculoskeletal: He exhibits no edema.  Lymphadenopathy:    He has no cervical adenopathy.  Neurological: He is alert and oriented to person, place, and time. He has normal patellar reflexes. He exhibits normal muscle tone. Coordination normal.  Skin: Skin is warm and dry.  Psychiatric: He has a normal mood and affect. His behavior is normal. Judgment and thought content normal.           Assessment & Plan:       Assessment & Plan:   Problem List Items Addressed This Visit       Unprioritized   Preventative health care - Primary    Encouraged pt to continue healthy diet, exercise.  Obtain routine lab work at [employer] request. He works for [employer] and reports that lab work is free.  Flu shot and Td today.  Recommended that he obtain bivalent covid booster at the pharmacy.       Relevant Orders   Comp Met (CMET)   TSH   CBC with Differential/Platelet   Lipid panel    [facility] does not currently have medications on file.  No orders of the defined types were placed in this encounter.

## 2020-11-08 LAB — LIPID PANEL
Chol/HDL Ratio: 4.9 ratio (ref 0.0–5.0)
Cholesterol, Total: 220 mg/dL — ABNORMAL HIGH (ref 100–199)
HDL: 45 mg/dL (ref >39)
LDL Chol Calc (NIH): 143 mg/dL — ABNORMAL HIGH (ref 0–99)
Triglycerides: 178 mg/dL — ABNORMAL HIGH (ref 0–149)
VLDL Cholesterol Cal: 32 mg/dL (ref 5–40)

## 2020-11-08 LAB — CBC WITH DIFFERENTIAL/PLATELET
Basophils Absolute: 0.1 10*3/uL (ref 0.0–0.2)
Basos: 1 %
EOS (ABSOLUTE): 0.1 10*3/uL (ref 0.0–0.4)
Eos: 1 %
Hematocrit: 48 % (ref 37.5–51.0)
Hemoglobin: 16.2 g/dL (ref 13.0–17.7)
Immature Grans (Abs): 0 10*3/uL (ref 0.0–0.1)
Immature Granulocytes: 0 %
Lymphocytes Absolute: 2.5 10*3/uL (ref 0.7–3.1)
Lymphs: 36 %
MCH: 30.4 pg (ref 26.6–33.0)
MCHC: 33.8 g/dL (ref 31.5–35.7)
MCV: 90 fL (ref 79–97)
Monocytes Absolute: 0.5 10*3/uL (ref 0.1–0.9)
Monocytes: 7 %
Neutrophils Absolute: 3.9 10*3/uL (ref 1.4–7.0)
Neutrophils: 55 %
Platelets: 257 10*3/uL (ref 150–450)
RBC: 5.33 x10E6/uL (ref 4.14–5.80)
RDW: 12.7 % (ref 11.6–15.4)
WBC: 7 10*3/uL (ref 3.4–10.8)

## 2020-11-08 LAB — COMPREHENSIVE METABOLIC PANEL
ALT: 28 IU/L (ref 0–44)
AST: 23 IU/L (ref 0–40)
Albumin/Globulin Ratio: 1.8 (ref 1.2–2.2)
Albumin: 4.6 g/dL (ref 4.0–5.0)
Alkaline Phosphatase: 96 IU/L (ref 44–121)
BUN/Creatinine Ratio: 14 (ref 9–20)
BUN: 11 mg/dL (ref 6–24)
Bilirubin Total: 0.3 mg/dL (ref 0.0–1.2)
CO2: 20 mmol/L (ref 20–29)
Calcium: 9.4 mg/dL (ref 8.7–10.2)
Chloride: 100 mmol/L (ref 96–106)
Creatinine, Ser: 0.76 mg/dL (ref 0.76–1.27)
Globulin, Total: 2.5 g/dL (ref 1.5–4.5)
Glucose: 100 mg/dL — ABNORMAL HIGH (ref 70–99)
Potassium: 4.2 mmol/L (ref 3.5–5.2)
Sodium: 138 mmol/L (ref 134–144)
Total Protein: 7.1 g/dL (ref 6.0–8.5)
eGFR: 114 mL/min/{1.73_m2} (ref >59)

## 2020-11-08 LAB — TSH: TSH: 2.4 u[IU]/mL (ref 0.450–4.500)

## 2021-11-08 ENCOUNTER — Encounter: Payer: Self-pay | Admitting: Family

## 2021-11-08 ENCOUNTER — Ambulatory Visit (INDEPENDENT_AMBULATORY_CARE_PROVIDER_SITE_OTHER): Payer: Managed Care, Other (non HMO) | Admitting: Family

## 2021-11-08 VITALS — BP 118/80 | HR 54 | Temp 97.8°F | Resp 16 | Ht 70.0 in | Wt 180.6 lb

## 2021-11-08 DIAGNOSIS — Z0000 Encounter for general adult medical examination without abnormal findings: Secondary | ICD-10-CM

## 2021-11-08 DIAGNOSIS — Z23 Encounter for immunization: Secondary | ICD-10-CM | POA: Diagnosis not present

## 2021-11-08 DIAGNOSIS — E785 Hyperlipidemia, unspecified: Secondary | ICD-10-CM

## 2021-11-08 NOTE — Assessment & Plan Note (Addendum)
Walking 6 miles 5 days a week.   Wt Readings from Last 3 Encounters:  11/08/21 180 lb 9.6 oz (81.9 kg)  11/07/20 186 lb 12.8 oz (84.7 kg)  11/07/19 191 lb (86.6 kg)   Continue healthy diet, exercise.  Flu shot today. Tetanus up to date. Pt is scheduled for eye exam. Dental up to date.

## 2021-11-08 NOTE — Progress Notes (Signed)
Subjective:   By signing my name below, I, Phillip Wheeler, attest that this documentation has been prepared under the direction and in the presence of Karie Chimera, NP 11/08/2021    Patient ID: Phillip Wheeler, male    DOB: 08/05/1975, 46 y.o.   MRN: [id number]  Chief Complaint  Patient presents with   Annual Exam    Pt states fasting     HPI Patient is in today for a comprehensive physical exam   Cholesterol: He is interested in checking his cholesterol levels. Lab Results  Component Value Date   HGBA1C 5.4 11/07/2019   Blood pressure: Patient's blood pressure is normal this visit BP Readings from Last 3 Encounters:  11/08/21 118/80  11/07/20 132/74  11/07/19 122/72   Pulse Readings from Last 3 Encounters:  11/08/21 (!) 54  11/07/20 63  11/07/19 67   Weight: His weight is decreasing from last visit. Wt Readings from Last 3 Encounters:  11/08/21 180 lb 9.6 oz (81.9 kg)  11/07/20 186 lb 12.8 oz (84.7 kg)  11/07/19 191 lb (86.6 kg)    He denies having any fever, hearing or vision symptoms, new muscle pain, joint pain , new moles, rashes, congestion, sinus pain, sore throat, palpations, cough, SOB ,wheezing,n/v/d constipation, blood in stool, dysuria, frequency, hematuria, depression, anxiety, headaches at this time  Social history: He reports no new medical family history and no new surgeries. Immunizations:  Patient is UTD on Tetanus. He interested in receiving the influenza vaccine this visit. Exercise: He engages in regular exercise. He walks everyday or at least 5 days a week for an hour Dental: He is UTD on his dental care Vision: He is UTD  on his vision care.   Health Maintenance Due  Topic Date Due   HIV Screening  Never done   Hepatitis C Screening  Never done   COVID-19 Vaccine (3 - Pfizer series) 08/15/2019   COLONOSCOPY (Pts 45-86yrs Insurance coverage will need to be confirmed)  Never done    Past Medical History:  Diagnosis Date    Migraine     Past Surgical History:  Procedure Laterality Date   VASECTOMY  2013    Family History  Problem Relation Age of Onset   Hypertension Mother    Hypothyroidism Mother    Diabetes Father    Hypertension Father    Heart disease Father        CABGT   CVA Father    Cancer Neg Hx    Hyperlipidemia Neg Hx     Social History   Socioeconomic History   Marital status: Married    Spouse name: Not on file   Number of children: Not on file   Years of education: Not on file   Highest education level: Not on file  Occupational History   Not on file  Tobacco Use   Smoking status: Never   Smokeless tobacco: Never  Substance and Sexual Activity   Alcohol use: No    Alcohol/week: 0.0 standard drinks of alcohol   Drug use: Never   Sexual activity: Yes    Partners: Female  Other Topics Concern   Not on file  Social History Narrative   Works as a Scientist, research (physical sciences), Engineer, technical sales. [employer]   Married   2 children   2009- son   2012-son   Grew up in [place], moved to [place] for work 2006   Enjoys Investment banker, corporate   Social Determinants of Radio broadcast assistant Strain: Not on file  Food Insecurity: Not on file  Transportation Needs: Not on file  Physical Activity: Not on file  Stress: Not on file  Social Connections: Not on file  Intimate Partner Violence: Not on file    No outpatient medications prior to visit.   No facility-administered medications prior to visit.    No Known Allergies  Review of Systems  Constitutional:  Negative for fever.  HENT:  Negative for congestion, sinus pain and sore throat.   Respiratory:  Negative for cough, shortness of breath and wheezing.   Cardiovascular:  Negative for palpitations.  Gastrointestinal:  Negative for blood in stool, constipation, diarrhea, nausea and vomiting.  Genitourinary:  Negative for dysuria, frequency and hematuria.  Musculoskeletal:  Negative for joint pain and myalgias.  Skin:  Negative for rash.       (-) New  Moles  Neurological:  Negative for headaches.  Psychiatric/Behavioral:  Negative for depression. The patient is not nervous/anxious.        Objective:    Physical Exam Constitutional:      General: He is not in acute distress.    Appearance: Normal appearance. He is not ill-appearing.  HENT:     Head: Normocephalic and atraumatic.     Right Ear: Tympanic membrane, ear canal and external ear normal.     Left Ear: Tympanic membrane, ear canal and external ear normal.  Eyes:     Extraocular Movements: Extraocular movements intact.     Pupils: Pupils are equal, round, and reactive to light.  Cardiovascular:     Rate and Rhythm: Normal rate and regular rhythm.     Heart sounds: Normal heart sounds. No murmur heard.    No gallop.  Pulmonary:     Effort: Pulmonary effort is normal. No respiratory distress.     Breath sounds: Normal breath sounds. No wheezing or rales.  Abdominal:     General: Bowel sounds are normal. There is no distension.     Palpations: Abdomen is soft.     Tenderness: There is no abdominal tenderness. There is no guarding.  Musculoskeletal:     Comments: 5/5 strength in both upper and lower extremities    Skin:    General: Skin is warm and dry.  Neurological:     Mental Status: He is alert and oriented to person, place, and time.     Deep Tendon Reflexes:     Reflex Scores:      Patellar reflexes are 2+ on the right side and 2+ on the left side. Psychiatric:        Mood and Affect: Mood normal.        Behavior: Behavior normal.        Judgment: Judgment normal.     BP 118/80 (BP Location: Left Arm, Patient Position: Sitting, Cuff Size: Normal)   Pulse (!) 54   Temp 97.8 F (36.6 C) (Oral)   Resp 16   Ht 5\' 10"  (1.778 m)   Wt 180 lb 9.6 oz (81.9 kg)   SpO2 98%   BMI 25.91 kg/m  Wt Readings from Last 3 Encounters:  11/08/21 180 lb 9.6 oz (81.9 kg)  11/07/20 186 lb 12.8 oz (84.7 kg)  11/07/19 191 lb (86.6 kg)       Assessment & Plan:    Problem List Items Addressed This Visit       Unprioritized   Preventative health care - Primary    Walking 6 miles 5 days a week.   Wt Readings from  Last 3 Encounters:  11/08/21 180 lb 9.6 oz (81.9 kg)  11/07/20 186 lb 12.8 oz (84.7 kg)  11/07/19 191 lb (86.6 kg)   Continue healthy diet, exercise.  Flu shot today. Tetanus up to date. Pt is scheduled for eye exam. Dental up to date.       Other Visit Diagnoses     Hyperlipidemia, unspecified hyperlipidemia type       Relevant Orders   Lipid panel   Need for influenza vaccination       Relevant Orders   Flu Vaccine QUAD 104mo+IM (Fluarix, Fluzone & Alfiuria Quad PF) (Completed)      No orders of the defined types were placed in this encounter.   I, Lemont Fillers, NP, personally preformed the services described in this documentation.  All medical record entries made by the scribe were at my direction and in my presence.  I have reviewed the chart and discharge instructions (if applicable) and agree that the record reflects my personal performance and is accurate and complete. 11/08/2021   I,Amber Collins,acting as a scribe for Lemont Fillers, NP.,have documented all relevant documentation on the behalf of Lemont Fillers, NP,as directed by  Lemont Fillers, NP while in the presence of Lemont Fillers, NP.    Lemont Fillers, NP

## 2021-11-08 NOTE — Assessment & Plan Note (Signed)
BP Readings from Last 3 Encounters:  11/08/21 118/80  11/07/20 132/74  11/07/19 122/72   BP looks great with diet/exercise.

## 2021-11-09 LAB — LIPID PANEL
Chol/HDL Ratio: 4.5 ratio (ref 0.0–5.0)
Cholesterol, Total: 202 mg/dL — ABNORMAL HIGH (ref 100–199)
HDL: 45 mg/dL (ref >39)
LDL Chol Calc (NIH): 134 mg/dL — ABNORMAL HIGH (ref 0–99)
Triglycerides: 129 mg/dL (ref 0–149)
VLDL Cholesterol Cal: 23 mg/dL (ref 5–40)

## 2022-12-12 ENCOUNTER — Encounter: Payer: Managed Care, Other (non HMO) | Admitting: Family
